# Patient Record
Sex: Female | Born: 2005 | Hispanic: Yes | Marital: Single | Smoking: Never smoker
Health system: Southern US, Community
[De-identification: ages and names within clinical notes are randomized; demographics above are authoritative.]

## PROBLEM LIST (undated history)

## (undated) DIAGNOSIS — J40 Bronchitis, not specified as acute or chronic: Secondary | ICD-10-CM

## (undated) DIAGNOSIS — J189 Pneumonia, unspecified organism: Secondary | ICD-10-CM

## (undated) DIAGNOSIS — J45909 Unspecified asthma, uncomplicated: Secondary | ICD-10-CM

## (undated) DIAGNOSIS — J020 Streptococcal pharyngitis: Secondary | ICD-10-CM

## (undated) HISTORY — PX: TONSILLECTOMY: SUR1361

## (undated) HISTORY — PX: TOOTH EXTRACTION: SUR596

---
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
## 2023-04-06 ENCOUNTER — Other Ambulatory Visit: Payer: Self-pay

## 2023-04-06 ENCOUNTER — Encounter (HOSPITAL_COMMUNITY): Payer: Self-pay | Admitting: Emergency Medicine

## 2023-04-06 ENCOUNTER — Emergency Department (HOSPITAL_COMMUNITY)
Admission: EM | Admit: 2023-04-06 | Discharge: 2023-04-06 | Disposition: A | Discharge status: Home / Self Care | Payer: Medicaid Other | Attending: Pediatric Emergency Medicine | Admitting: Pediatric Emergency Medicine

## 2023-04-06 ENCOUNTER — Emergency Department (HOSPITAL_COMMUNITY): Payer: Medicaid Other

## 2023-04-06 DIAGNOSIS — S63642A Sprain of metacarpophalangeal joint of left thumb, initial encounter: Secondary | ICD-10-CM

## 2023-04-06 DIAGNOSIS — X509XXA Other and unspecified overexertion or strenuous movements or postures, initial encounter: Secondary | ICD-10-CM | POA: Diagnosis not present

## 2023-04-06 DIAGNOSIS — M79642 Pain in left hand: Secondary | ICD-10-CM | POA: Diagnosis present

## 2023-04-06 NOTE — ED Provider Notes (Signed)
  [place] EMERGENCY DEPARTMENT AT [facility] Provider Note   CSN: [id number] Arrival date & time: 04/06/23  1629     History {Add pertinent medical, surgical, social history, OB history to HPI:1} Chief Complaint  Patient presents with   Hand Injury    [name] [name] is a 17 y.o. female.   Hand Injury      Home Medications Prior to Admission medications   Medication Sig Start Date End Date Taking? Authorizing Provider  albuterol (VENTOLIN HFA) 108 (90 Base) MCG/ACT inhaler Inhale 2 puffs into the lungs every 6 (six) hours as needed for wheezing or shortness of breath. 10/25/19   Cori Razor, MD  beclomethasone (QVAR) 40 MCG/ACT inhaler Inhale 2 puffs into the lungs 2 (two) times daily.    [provider]  cetirizine (ZYRTEC ALLERGY) 10 MG tablet Take 1 tablet (10 mg total) by mouth daily. 10/25/19 10/24/20  Cori Razor, MD  ibuprofen (CHILDRENS MOTRIN) 100 MG/5ML suspension Take 20 mLs (400 mg total) by mouth every 6 (six) hours as needed for mild pain. 01/17/17   Sherrilee Gilles, NP      Allergies    Patient has no known allergies.    Review of Systems   Review of Systems  Physical Exam Updated Vital Signs BP 122/70 (BP Location: Right Arm)   Pulse 70   Temp 98.1 F (36.7 C)   Resp 16   Ht 5\' 4"  (1.626 m)   Wt 68 kg   LMP 03/16/2023   SpO2 100%   BMI 25.75 kg/m  Physical Exam  ED Results / Procedures / Treatments   Labs (all labs ordered are listed, but only abnormal results are displayed) Labs Reviewed - No data to display  EKG None  Radiology DG Hand Complete Left  Result Date: 04/06/2023 CLINICAL DATA:  Left hand injury while moving furniture EXAM: LEFT HAND - COMPLETE 3 VIEW COMPARISON:  None Available. FINDINGS: There is no evidence of fracture or dislocation. There is no evidence of arthropathy or other focal bone abnormality. Soft tissues are unremarkable. IMPRESSION: No acute fracture or dislocation.  Electronically Signed   By: Agustin Cree M.D.   On: 04/06/2023 17:29    Procedures Procedures  {Document cardiac monitor, telemetry assessment procedure when appropriate:1}  Medications Ordered in ED Medications - No data to display  ED Course/ Medical Decision Making/ A&P   {   Click here for ABCD2, HEART and other calculatorsREFRESH Note before signing :1}                          Medical Decision Making Amount and/or Complexity of Data Reviewed Radiology: ordered.   ***  {Document critical care time when appropriate:1} {Document review of labs and clinical decision tools ie heart score, Chads2Vasc2 etc:1}  {Document your independent review of radiology images, and any outside records:1} {Document your discussion with family members, caretakers, and with consultants:1} {Document social determinants of health affecting pt's care:1} {Document your decision making why or why not admission, treatments were needed:1} Final Clinical Impression(s) / ED Diagnoses Final diagnoses:  Sprain of metacarpophalangeal (MCP) joint of left thumb, initial encounter    Rx / DC Orders ED Discharge Orders     None

## 2023-04-06 NOTE — Progress Notes (Signed)
Orthopedic Tech Progress Note Patient Details:  [name] [name] August 06, 2006 [id number]  Ortho Devices Type of Ortho Device: Thumb velcro splint Ortho Device/Splint Location: lue Ortho Device/Splint Interventions: Ordered, Application, Adjustment  I applied the brace and taught the patient to apply it. Post Interventions Patient Tolerated: Well Instructions Provided: Care of device, Adjustment of device  Trinna Post 04/06/2023, 9:53 PM

## 2023-04-06 NOTE — ED Provider Triage Note (Signed)
Emergency Medicine Provider Triage Evaluation Note  Chelsae Mcgloin , a 17 y.o. female  was evaluated in triage.  Pt complains of L hand pain since this AM. States she was lifting up her bed to move it and felt pain near her thumb. Denies numbness or swelling.  Review of Systems  Positive: L hand pain Negative: edema  Physical Exam  BP 125/70 (BP Location: Right Arm)   Pulse (!) 108   Temp 98 F (36.7 C) (Oral)   Resp 15   Ht 5\' 4"  (1.626 m)   Wt 68 kg   LMP 03/16/2023   SpO2 98%   BMI 25.75 kg/m  Gen:   Awake, no distress   Resp:  Normal effort  MSK:   Moves extremities without difficulty  Other:  Left hand: TTP of dorsal aspect of digit 1. Radial pulses present. Grip strength intact. Able to flex, extend, ulnar and radial deviate wrist. Two point discrimination intact. Normal thumb opposition. Intact ROM for all MCPs, PIPs, and DIPs.  No snuffbox ttp. No sensory deficits. Capillary refill <2sec   Medical Decision Making  Medically screening exam initiated at 5:03 PM.  Appropriate orders placed.  Infant Bauch was informed that the remainder of the evaluation will be completed by another provider, this initial triage assessment does not replace that evaluation, and the importance of remaining in the ED until their evaluation is complete.     Pete Pelt, [place] 04/06/23 1704

## 2023-04-06 NOTE — ED Triage Notes (Signed)
Pt was moving heavy furniture today and started to have a pain in left palm and thumb. Pt is able to move hand but states it hurts to move thumb. Did not drop any furniture on hand. Does not appear to be swollen

## 2023-04-06 NOTE — ED Notes (Signed)
Discharge instructions reviewed with caregiver at the bedside. They indicated understanding of the same. Patient ambulated out of the ED in the care of caregiver.   

## 2023-10-18 ENCOUNTER — Other Ambulatory Visit: Payer: Self-pay

## 2023-10-18 ENCOUNTER — Encounter (HOSPITAL_COMMUNITY): Payer: Self-pay

## 2023-10-18 ENCOUNTER — Emergency Department (HOSPITAL_COMMUNITY)
Admission: EM | Admit: 2023-10-18 | Discharge: 2023-10-18 | Disposition: A | Discharge status: Home / Self Care | Payer: Medicaid Other | Attending: Emergency Medicine | Admitting: Emergency Medicine

## 2023-10-18 ENCOUNTER — Emergency Department (HOSPITAL_COMMUNITY): Payer: Medicaid Other

## 2023-10-18 DIAGNOSIS — R002 Palpitations: Secondary | ICD-10-CM | POA: Diagnosis present

## 2023-10-18 DIAGNOSIS — Z20822 Contact with and (suspected) exposure to covid-19: Secondary | ICD-10-CM | POA: Insufficient documentation

## 2023-10-18 LAB — BASIC METABOLIC PANEL
Anion gap: 17 — ABNORMAL HIGH (ref 5–15)
BUN: 8 mg/dL (ref 6–20)
CO2: 20 mmol/L — ABNORMAL LOW (ref 22–32)
Calcium: 9.6 mg/dL (ref 8.9–10.3)
Chloride: 102 mmol/L (ref 98–111)
Creatinine, Ser: 0.81 mg/dL (ref 0.44–1.00)
GFR, Estimated: >60 mL/min (ref >60)
Glucose, Bld: 96 mg/dL (ref 70–99)
Potassium: 3.9 mmol/L (ref 3.5–5.1)
Sodium: 139 mmol/L (ref 135–145)

## 2023-10-18 LAB — HCG, SERUM, QUALITATIVE: Preg, Serum: NEGATIVE

## 2023-10-18 LAB — CBC
HCT: 40.6 % (ref 36.0–46.0)
Hemoglobin: 14.2 g/dL (ref 12.0–15.0)
MCH: 32.3 pg (ref 26.0–34.0)
MCHC: 35 g/dL (ref 30.0–36.0)
MCV: 92.3 fL (ref 80.0–100.0)
Platelets: 247 10*3/uL (ref 150–400)
RBC: 4.4 MIL/uL (ref 3.87–5.11)
RDW: 11.9 % (ref 11.5–15.5)
WBC: 6.5 10*3/uL (ref 4.0–10.5)
nRBC: 0 % (ref 0.0–0.2)

## 2023-10-18 LAB — D-DIMER, QUANTITATIVE: D-Dimer, Quant: <0.27 ug{FEU}/mL (ref 0.00–0.50)

## 2023-10-18 LAB — RESP PANEL BY RT-PCR (RSV, FLU A&B, COVID)  RVPGX2
Influenza A by PCR: NEGATIVE
Influenza B by PCR: NEGATIVE
Resp Syncytial Virus by PCR: NEGATIVE
SARS Coronavirus 2 by RT PCR: NEGATIVE

## 2023-10-18 LAB — TROPONIN I (HIGH SENSITIVITY)
Troponin I (High Sensitivity): <2 ng/L (ref <18)
Troponin I (High Sensitivity): <2 ng/L (ref <18)

## 2023-10-18 LAB — TSH: TSH: 0.509 u[IU]/mL (ref 0.350–4.500)

## 2023-10-18 LAB — MAGNESIUM: Magnesium: 2.1 mg/dL (ref 1.7–2.4)

## 2023-10-18 MED ORDER — LACTATED RINGERS IV BOLUS
1000.0000 mL | Freq: Once | INTRAVENOUS | Status: AC
  Administered 2023-10-18: 1000 mL via INTRAVENOUS

## 2023-10-18 NOTE — ED Triage Notes (Signed)
Pt reports since yesterday she has been experiencing heart palpitations, more so with exertion. Pt reports increased stress lately.

## 2023-10-18 NOTE — ED Provider Notes (Signed)
[place] EMERGENCY DEPARTMENT AT [facility] Provider Note   CSN: [id number] Arrival date & time: 10/18/23  1010     History  Chief Complaint  Patient presents with   Palpitations    [name] [name] is a 18 y.o. female.  HPI 18 year old female presents with palpitations.  She has felt her heart beating quickly since yesterday morning.  She felt lightheaded yesterday but not today.  The palpitations have continued.  She does feel short of breath with exertion.  There is no chest pain though her arm has felt tingly on the left side.  No tingling or numbness to the her leg.  No weakness to her left arm.  No fevers, cough, sore throat.  No recent travel or surgery, calf pain and or leg swelling.  Denies any chronic medications.  Home Medications Prior to Admission medications   Medication Sig Start Date End Date Taking? Authorizing Provider  albuterol (VENTOLIN HFA) 108 (90 Base) MCG/ACT inhaler Inhale 2 puffs into the lungs every 6 (six) hours as needed for wheezing or shortness of breath. 10/25/19   Cori Razor, MD  beclomethasone (QVAR) 40 MCG/ACT inhaler Inhale 2 puffs into the lungs 2 (two) times daily.    [provider]  cetirizine (ZYRTEC ALLERGY) 10 MG tablet Take 1 tablet (10 mg total) by mouth daily. 10/25/19 10/24/20  Cori Razor, MD  ibuprofen (CHILDRENS MOTRIN) 100 MG/5ML suspension Take 20 mLs (400 mg total) by mouth every 6 (six) hours as needed for mild pain. 01/17/17   Sherrilee Gilles, NP      Allergies    Patient has no known allergies.    Review of Systems   Review of Systems  Constitutional:  Negative for fever.  Respiratory:  Positive for shortness of breath. Negative for cough.   Cardiovascular:  Positive for palpitations. Negative for chest pain and leg swelling.  Gastrointestinal:  Negative for abdominal pain, diarrhea and vomiting.  Neurological:  Positive for light-headedness. Negative for weakness.     Physical Exam Updated Vital Signs BP 121/77 (BP Location: Right Arm)   Pulse 94   Temp 97.9 F (36.6 C) (Oral)   Resp 16   Ht 5\' 5"  (1.651 m)   Wt 57.2 kg   LMP 10/18/2023 (Exact Date)   SpO2 100%   BMI 20.97 kg/m  Physical Exam Vitals and nursing note reviewed.  Constitutional:      Appearance: She is well-developed.  HENT:     Head: Normocephalic and atraumatic.  Cardiovascular:     Rate and Rhythm: Regular rhythm. Tachycardia present.     Pulses:          Radial pulses are 2+ on the left side.     Heart sounds: Normal heart sounds.  Pulmonary:     Effort: Pulmonary effort is normal.     Breath sounds: Normal breath sounds.  Abdominal:     Palpations: Abdomen is soft.     Tenderness: There is no abdominal tenderness.  Skin:    General: Skin is warm and dry.  Neurological:     Mental Status: She is alert.     Comments: 5/5 strength in all 4 extremities. Feels subjective decreased sensation in left arm. Normal sensation in RUE, RLE, LLE.     ED Results / Procedures / Treatments   Labs (all labs ordered are listed, but only abnormal results are displayed) Labs Reviewed  BASIC METABOLIC PANEL - Abnormal; Notable for the following components:  Result Value   CO2 20 (*)    Anion gap 17 (*)    All other components within normal limits  RESP PANEL BY RT-PCR (RSV, FLU A&B, COVID)  RVPGX2  CBC  HCG, SERUM, QUALITATIVE  MAGNESIUM  D-DIMER, QUANTITATIVE  TSH  TROPONIN I (HIGH SENSITIVITY)  TROPONIN I (HIGH SENSITIVITY)    EKG EKG Interpretation Date/Time:  Sunday October 18 2023 10:20:16 EST Ventricular Rate:  124 PR Interval:  118 QRS Duration:  74 QT Interval:  320 QTC Calculation: 459 R Axis:   234  Text Interpretation: Sinus tachycardia Possible Right ventricular hypertrophy diffuse nonspecific T wave changes, possibly rate related No previous ECGs available Confirmed by Pricilla Loveless [phone]) on 10/18/2023 10:35:56 AM  Radiology DG Chest 2  View Result Date: 10/18/2023 CLINICAL DATA:  18 year old female with history of tachycardia. EXAM: CHEST - 2 VIEW COMPARISON:  Chest x-ray 01/12/2009. FINDINGS: Lung volumes are normal. No consolidative airspace disease. No pleural effusions. No pneumothorax. No pulmonary nodule or mass noted. Pulmonary vasculature and the cardiomediastinal silhouette are within normal limits. IMPRESSION: 1. No radiographic evidence of acute cardiopulmonary disease. Electronically Signed   By: Trudie Reed M.D.   On: 10/18/2023 12:16    Procedures Procedures    Medications Ordered in ED Medications  lactated ringers bolus 1,000 mL (0 mLs Intravenous Stopped 10/18/23 1235)  lactated ringers bolus 1,000 mL (0 mLs Intravenous Stopped 10/18/23 1419)    ED Course/ Medical Decision Making/ A&P                                 Medical Decision Making Amount and/or Complexity of Data Reviewed Labs: ordered.    Details: Unremarkable including normal TSH, troponin.  D-dimer normal Radiology: ordered and independent interpretation performed.    Details: No pneumonia or CHF ECG/medicine tests: ordered and independent interpretation performed.    Details: Sinus tachycardia   Unclear what is causing her tachycardia.  She did respond to fluids and her heart rate has improved and she feels symptomatically better.  She was able to ambulate without dyspnea.  Heart rate is now normal.  Repeat EKG was obtained but did not crossover into MUSE but does appear to have similar nonspecific T waves though a little bit of a slower heart rate.  No specific PE risk factors and with a negative D-dimer I think this is unlikely.  Given she has improved I think it would be reasonable to have her increase fluids and follow-up with her PCP.  Of note she had a very slight anion gap acidosis but perhaps is from dehydration as she otherwise appears well and has steadily improved.  No signs of infection.  Will discharge home with return  precautions.        Final Clinical Impression(s) / ED Diagnoses Final diagnoses:  Palpitations    Rx / DC Orders ED Discharge Orders     None         Pricilla Loveless, MD 10/18/23 1540

## 2023-10-18 NOTE — Discharge Instructions (Addendum)
Be sure to drink extra fluids at home. Follow up closely with your primary care provider, call them tomorrow for outpatient follow up. Otherwise, if you develop new or worsening shortness of breath, chest pain, dizziness, palpitations, or any other new/concerning symptoms then return to the ER or call 911.

## 2023-10-18 NOTE — ED Notes (Signed)
Pt understood d/c instructions and when to return to the ED. IV was d/c and pt left to go home w/ her family
